# Patient Record
Sex: Male | Born: 1935 | Hispanic: No | Smoking: Former smoker
Health system: Southern US, Community
[De-identification: ages and names within clinical notes are randomized; demographics above are authoritative.]

## PROBLEM LIST (undated history)

## (undated) DIAGNOSIS — I2699 Other pulmonary embolism without acute cor pulmonale: Secondary | ICD-10-CM

## (undated) DIAGNOSIS — R0602 Shortness of breath: Secondary | ICD-10-CM

## (undated) DIAGNOSIS — Z7901 Long term (current) use of anticoagulants: Secondary | ICD-10-CM

## (undated) DIAGNOSIS — I82409 Acute embolism and thrombosis of unspecified deep veins of unspecified lower extremity: Secondary | ICD-10-CM

## (undated) DIAGNOSIS — K2970 Gastritis, unspecified, without bleeding: Secondary | ICD-10-CM

## (undated) HISTORY — DX: Other pulmonary embolism without acute cor pulmonale: I26.99

## (undated) HISTORY — DX: Acute embolism and thrombosis of unspecified deep veins of unspecified lower extremity: I82.409

## (undated) HISTORY — DX: Gastritis, unspecified, without bleeding: K29.70

## (undated) HISTORY — DX: Long term (current) use of anticoagulants: Z79.01

## (undated) HISTORY — DX: Shortness of breath: R06.02

---

## 2010-04-19 ENCOUNTER — Ambulatory Visit (INDEPENDENT_AMBULATORY_CARE_PROVIDER_SITE_OTHER): Payer: Medicare Other

## 2010-04-19 ENCOUNTER — Emergency Department (HOSPITAL_COMMUNITY): Payer: Medicare Other

## 2010-04-19 ENCOUNTER — Inpatient Hospital Stay (HOSPITAL_COMMUNITY)
Admission: EM | Admit: 2010-04-19 | Discharge: 2010-04-24 | DRG: 176 | Disposition: A | Discharge status: Home / Self Care | Payer: Medicare Other | Attending: Cardiovascular Disease | Admitting: Cardiovascular Disease

## 2010-04-19 ENCOUNTER — Inpatient Hospital Stay (INDEPENDENT_AMBULATORY_CARE_PROVIDER_SITE_OTHER)
Admission: RE | Admit: 2010-04-19 | Discharge: 2010-04-19 | Disposition: A | Discharge status: Home / Self Care | Payer: Medicare Other | Source: Ambulatory Visit | Attending: Family Medicine | Admitting: Family Medicine

## 2010-04-19 DIAGNOSIS — I209 Angina pectoris, unspecified: Secondary | ICD-10-CM

## 2010-04-19 DIAGNOSIS — I824Y9 Acute embolism and thrombosis of unspecified deep veins of unspecified proximal lower extremity: Secondary | ICD-10-CM | POA: Diagnosis present

## 2010-04-19 DIAGNOSIS — I10 Essential (primary) hypertension: Secondary | ICD-10-CM | POA: Diagnosis present

## 2010-04-19 DIAGNOSIS — I2699 Other pulmonary embolism without acute cor pulmonale: Principal | ICD-10-CM | POA: Diagnosis present

## 2010-04-19 DIAGNOSIS — K2970 Gastritis, unspecified, without bleeding: Secondary | ICD-10-CM | POA: Diagnosis present

## 2010-04-19 LAB — CBC
HCT: 38.9 % — ABNORMAL LOW (ref 39.0–52.0)
MCH: 29.4 pg (ref 26.0–34.0)
MCHC: 32.9 g/dL (ref 30.0–36.0)
RDW: 13.9 % (ref 11.5–15.5)

## 2010-04-19 LAB — BASIC METABOLIC PANEL
Calcium: 8.7 mg/dL (ref 8.4–10.5)
Creatinine, Ser: 1.24 mg/dL (ref 0.4–1.5)
GFR calc non Af Amer: 57 mL/min — ABNORMAL LOW (ref >60)
Glucose, Bld: 78 mg/dL (ref 70–99)
Sodium: 141 mEq/L (ref 135–145)

## 2010-04-19 LAB — DIFFERENTIAL
Basophils Absolute: 0 10*3/uL (ref 0.0–0.1)
Basophils Relative: 0 % (ref 0–1)
Eosinophils Relative: 3 % (ref 0–5)
Lymphocytes Relative: 23 % (ref 12–46)
Monocytes Absolute: 0.7 10*3/uL (ref 0.1–1.0)
Monocytes Relative: 7 % (ref 3–12)

## 2010-04-19 LAB — CK TOTAL AND CKMB (NOT AT ARMC): Relative Index: 1.6 (ref 0.0–2.5)

## 2010-04-20 ENCOUNTER — Emergency Department (HOSPITAL_COMMUNITY): Payer: Medicare Other

## 2010-04-20 DIAGNOSIS — R0602 Shortness of breath: Secondary | ICD-10-CM

## 2010-04-20 DIAGNOSIS — R079 Chest pain, unspecified: Secondary | ICD-10-CM

## 2010-04-20 DIAGNOSIS — R072 Precordial pain: Secondary | ICD-10-CM

## 2010-04-20 LAB — CK TOTAL AND CKMB (NOT AT ARMC)
CK, MB: 2.1 ng/mL (ref 0.3–4.0)
CK, MB: 2.5 ng/mL (ref 0.3–4.0)
Relative Index: 1.6 (ref 0.0–2.5)
Relative Index: 1.6 (ref 0.0–2.5)
Total CK: 135 U/L (ref 7–232)

## 2010-04-20 LAB — PROTIME-INR: INR: 1.19 (ref 0.00–1.49)

## 2010-04-20 LAB — BRAIN NATRIURETIC PEPTIDE: Pro B Natriuretic peptide (BNP): 59 pg/mL (ref 0.0–100.0)

## 2010-04-20 LAB — TROPONIN I: Troponin I: 0.01 ng/mL (ref 0.00–0.06)

## 2010-04-20 LAB — HEPARIN LEVEL (UNFRACTIONATED): Heparin Unfractionated: 0.79 IU/mL — ABNORMAL HIGH (ref 0.30–0.70)

## 2010-04-20 LAB — TSH: TSH: 0.906 u[IU]/mL (ref 0.350–4.500)

## 2010-04-20 MED ORDER — IOHEXOL 300 MG/ML  SOLN
80.0000 mL | Freq: Once | INTRAMUSCULAR | Status: AC | PRN
  Administered 2010-04-20: 80 mL via INTRAVENOUS

## 2010-04-21 DIAGNOSIS — I2699 Other pulmonary embolism without acute cor pulmonale: Secondary | ICD-10-CM

## 2010-04-21 LAB — PROTIME-INR
INR: 1.11 (ref 0.00–1.49)
Prothrombin Time: 14.5 seconds (ref 11.6–15.2)

## 2010-04-21 LAB — HEPARIN LEVEL (UNFRACTIONATED): Heparin Unfractionated: 0.58 IU/mL (ref 0.30–0.70)

## 2010-04-21 LAB — CBC
MCH: 30 pg (ref 26.0–34.0)
MCHC: 33.5 g/dL (ref 30.0–36.0)
RDW: 14.2 % (ref 11.5–15.5)

## 2010-04-22 LAB — CBC
HCT: 36.4 % — ABNORMAL LOW (ref 39.0–52.0)
MCH: 29.8 pg (ref 26.0–34.0)
MCV: 89.7 fL (ref 78.0–100.0)
RDW: 14.3 % (ref 11.5–15.5)
WBC: 7.9 10*3/uL (ref 4.0–10.5)

## 2010-04-22 LAB — HEPARIN LEVEL (UNFRACTIONATED): Heparin Unfractionated: 0.51 IU/mL (ref 0.30–0.70)

## 2010-04-22 NOTE — Consult Note (Signed)
NAME:  Phillip Sandoval, Phillip Sandoval                ACCOUNT NO.:  [id number]  MEDICAL RECORD NO.:  [id number]           PATIENT TYPE:  I  LOCATION:  3741                         FACILITY:  [facility]  PHYSICIAN:  Noralyn Pick. Eden Emms, MD, FACCDATE OF BIRTH:  08/13/1932  DATE OF CONSULTATION: DATE OF DISCHARGE:                                CONSULTATION   A 77 year old patient we were asked to see in the CDU for shortness of breath, question chest pain.  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  He has had increasing dyspnea for 2 weeks.  He is still quite active and looks quite younger than his stated age.  He goes to [employer] and does martial arts on a daily basis.  He has had progressive dyspnea with some chest tightness when his dyspnea is bad.  He denies cough, fever, or syncope.  He has had no lower extremity edema or history of DVT or PE.  As per the CDU protocol, his point-of-care markers were negative.  His baseline EKG was normal.  He was sent for a stress echo.  I reviewed his images and EKG.  His resting echo images were normal with a good EF. His stress images were also normal, but his maximum heart rate was only 100 beats per minute, and he exercise less than 3 minutes.  Because of a nondiagnostic stress echo, we were asked to see him.  He currently is asymptomatic.  However, he complains of fairly marked change in his breathing pattern and dyspnea over the last 2 weeks.  A 10-point review of systems is negative including no cough, fever, or sputum.  No lower extremity edema.  No previous history of valvular disease or coronary artery disease.  PAST MEDICAL HISTORY:  Remarkable for hypertension, which he is on Lopressor for and gastritis, which he takes Protonix for.  He denies any allergies.  He is divorced.  He has family in the area.  He is from [place] originally.  He does not smoke or drink.  He quit smoking over 10 years ago.  He is  originally from [place].  He is extremely active going to the [employer] and doing martial arts on a regular basis.  FAMILY HISTORY:  Noncontributory.  Mother and father do not have premature coronary disease.  PHYSICAL EXAMINATION:  GENERAL:  Remarkable for comfortable male in no distress, looking younger than stated age. VITAL SIGNS:  Stable.  Blood pressure is 118/64, resting pulse was 76. HEENT:  Unremarkable. NECK:  Carotids normal without bruit.  No lymphadenopathy, thyromegaly, or JVP elevation. LUNGS:  Clear.  Good diaphragmatic motion.  No wheezing. CARDIAC:  Normal S1 and S2.  Normal heart sounds.  PMI normal. ABDOMEN:  Benign.  Bowel sounds positive.  No AAA.  No tenderness.  No bruit.  No hepatosplenomegaly, hepatojugular reflux, or tenderness. EXTREMITIES:  Distal pulses were intact.  No edema. NEURO:  Nonfocal. SKIN:  Warm and dry.  No muscular weakness.  Chest x-ray shows a bit of a tortuous aorta with no active disease. Stress echo reviewed.  Normal resting and stress  images.  No EKG changes, but nondiagnostic due to failure to achieve adequate heart rate.  Point-of-care enzymes are negative.  IMPRESSION: 1. Progressive dyspnea with some pressure during dyspnea.  Clinical     symptoms disproportionate to physical findings.  Check D-dimer and     BNP. Low threshold to get CT to R/O PE as this is most likely diagnosis     Admit to the hospital. Start heparin if d-dimer positive  Further     workup based on the results of his laboratory data. 2. Hypertension.  Continue Lopressor 25 b.i.d. 3. History of gastritis.  Continue Protonix.     Noralyn Pick. Eden Emms, MD, [facility]     PCN/MEDQ  D:  04/20/2010  T:  04/21/2010  Job:  [id number]  Electronically Signed by Charlton Haws MD [employer] on 04/22/2010 03:38:35 PM

## 2010-04-23 LAB — CBC
HCT: 36.8 % — ABNORMAL LOW (ref 39.0–52.0)
Hemoglobin: 12.4 g/dL — ABNORMAL LOW (ref 13.0–17.0)
MCHC: 33.7 g/dL (ref 30.0–36.0)
MCV: 90.6 fL (ref 78.0–100.0)
WBC: 7.3 10*3/uL (ref 4.0–10.5)

## 2010-04-23 LAB — PROTIME-INR
INR: 1.87 — ABNORMAL HIGH (ref 0.00–1.49)
Prothrombin Time: 21.7 s — ABNORMAL HIGH (ref 11.6–15.2)

## 2010-04-24 LAB — CBC
HCT: 36.1 % — ABNORMAL LOW (ref 39.0–52.0)
Hemoglobin: 12 g/dL — ABNORMAL LOW (ref 13.0–17.0)
MCV: 90 fL (ref 78.0–100.0)
WBC: 7.5 10*3/uL (ref 4.0–10.5)

## 2010-04-24 LAB — PROTIME-INR: INR: 2.52 — ABNORMAL HIGH (ref 0.00–1.49)

## 2010-04-26 ENCOUNTER — Ambulatory Visit (INDEPENDENT_AMBULATORY_CARE_PROVIDER_SITE_OTHER): Payer: Medicare Other | Admitting: *Deleted

## 2010-04-26 DIAGNOSIS — I82409 Acute embolism and thrombosis of unspecified deep veins of unspecified lower extremity: Secondary | ICD-10-CM

## 2010-04-26 DIAGNOSIS — I2699 Other pulmonary embolism without acute cor pulmonale: Secondary | ICD-10-CM | POA: Insufficient documentation

## 2010-04-26 DIAGNOSIS — Z7901 Long term (current) use of anticoagulants: Secondary | ICD-10-CM

## 2010-04-26 LAB — POCT INR: INR: 2.2

## 2010-04-28 NOTE — Discharge Summary (Signed)
NAME:  [name], [name]                ACCOUNT NO.:  [id number]  MEDICAL RECORD NO.:  [id number]           PATIENT TYPE:  I  LOCATION:  3741                         FACILITY:  [facility]  PHYSICIAN:  Noralyn Pick. Eden Emms, MD, FACCDATE OF BIRTH:  04/11/1932  DATE OF ADMISSION:  04/19/2010 DATE OF DISCHARGE:  04/24/2010                              DISCHARGE SUMMARY   PROCEDURES: 1. CT angiogram of the chest. 2. Chest x-ray. 3. Stress echocardiogram. 4. Arterial and venous lower extremity ultrasound.  PRIMARY, FINAL, DISCHARGE DIAGNOSES: 1. Bilateral pulmonary emboli, right greater than left. 2. Bilateral deep vein thrombosis in the femoral and popliteal veins     by ultrasound. 3. Dyspnea secondary to pulmonary embolism.  SECONDARY DIAGNOSES: 1. History of gastritis for which he takes Protonix. 2. The patient was listed as having hypertension, however, this is an     incorrect diagnosis and he has never been diagnosed with     hypertension.  TIME AT DISCHARGE:  42 minutes.  HOSPITAL COURSE:  Mr. Phillip Sandoval is a 77 year old male with no previous cardiac issues.  He came to the hospital for dyspnea and his cardiac enzymes were negative for MI.  His BNP and TSH were within normal limits.  Initially, he was scheduled for stress echo.  The target heart rate was not achieved.  The 85% of his predicted maximum was 122.  Since he only achieved a HR of 100 Cardiology was consulted.  After interviewing the patient and reviewing the echo, a CT angiogram of the chest was ordered.  The CT angiogram of the chest showed acute bilateral pulmonary emboli with moderately large clot burden.  The right distal main pulmonary artery and all lobes of the right lung were involved.  On the left, it was mainly in the lower lobe, but also in the upper lobe.  He was started on heparin and Coumadin.  Lower extremity Dopplers were performed, which showed bilateral DVTs in the femoral veins and popliteal veins.  The CT  angiogram of the chest had shown no mediastinal adenopathy and the only structural abnormality was a moderately large hiatal hernia.  His Coumadin and his anticoagulation with Lovenox were managed by pharmacy.  Mr. Pardee dyspnea improved.  Initially, it was thought that he had a history of hypertension and was on metoprolol, but both of these turned out to be incorrect.  He does not carry a diagnosis of hypertension and has never taken metoprolol prior to admission.  On April 24, 2010, Mr. Leatham INR was therapeutic at 2.52.  His vital signs were stable with a blood pressure of 111/77 and O2 saturation of 97% on room air.  His heart rate was in the 60s.  He was evaluated by Dr. Eden Emms and considered stable for discharge, to follow up as an outpatient.  DISCHARGE INSTRUCTIONS:  His activity level is to be increased gradually.  He is not to do any lifting for 3 weeks.  He may shower.  He is not to have any sexual activity for 2 weeks.  He is encouraged to stick to a low-sodium heart-healthy diet.  He is to follow  up with Dr. Eden Emms and our office will call.  He is to follow up with Dr. Ronne Binning as needed.  He is to get a Coumadin. Check on April 26, 2010 (Mr. Koelzer prefers to get the check at Dr. Dimas Millin office).  DISCHARGE MEDICATIONS: 1. Prilosec 20 mg a day. 2. Coumadin 5 mg a day or as directed.     Theodore Demark, PA-C   ______________________________ Noralyn Pick. Eden Emms, MD, [facility]    RB/MEDQ  D:  04/24/2010  T:  04/24/2010  Job:  [id number]  cc:   Dr. Ronne Binning [facility]  Electronically Signed by Theodore Demark PA-C on 04/27/2010 09:48:00 AM Electronically Signed by Charlton Haws MD [facility] on 04/28/2010 11:31:48 AM

## 2010-05-03 ENCOUNTER — Encounter: Payer: Medicare Other | Admitting: *Deleted

## 2010-05-13 ENCOUNTER — Encounter: Payer: Self-pay | Admitting: *Deleted

## 2010-05-13 ENCOUNTER — Encounter: Payer: Self-pay | Admitting: Cardiovascular Disease

## 2010-05-16 ENCOUNTER — Encounter: Payer: Medicare Other | Admitting: Cardiovascular Disease

## 2010-05-31 ENCOUNTER — Encounter: Payer: Medicare Other | Admitting: Cardiovascular Disease

## 2010-06-28 ENCOUNTER — Encounter: Payer: Self-pay | Admitting: Cardiovascular Disease

## 2010-08-03 ENCOUNTER — Encounter: Payer: Self-pay | Admitting: Cardiovascular Disease

## 2010-08-03 ENCOUNTER — Ambulatory Visit (INDEPENDENT_AMBULATORY_CARE_PROVIDER_SITE_OTHER): Payer: Medicare Other | Admitting: Cardiovascular Disease

## 2010-08-03 ENCOUNTER — Ambulatory Visit (INDEPENDENT_AMBULATORY_CARE_PROVIDER_SITE_OTHER): Payer: Medicare Other | Admitting: *Deleted

## 2010-08-03 DIAGNOSIS — I82409 Acute embolism and thrombosis of unspecified deep veins of unspecified lower extremity: Secondary | ICD-10-CM

## 2010-08-03 DIAGNOSIS — Z7901 Long term (current) use of anticoagulants: Secondary | ICD-10-CM

## 2010-08-03 DIAGNOSIS — I2699 Other pulmonary embolism without acute cor pulmonale: Secondary | ICD-10-CM

## 2010-08-03 NOTE — Assessment & Plan Note (Signed)
Dysnpnea improved  CTA in 3 months to assess clots.  Coumadin for a year 4/13

## 2010-08-03 NOTE — Assessment & Plan Note (Signed)
Check INR today.  F/U at least monthly in our clinic

## 2010-08-03 NOTE — Progress Notes (Signed)
Phillip Sandoval is a 77 year old male with no previous cardiac issues. He came to the hospital for dyspnea and his cardiac  enzymes were negative for MI. His BNP and TSH were within normal limits. Initially, he was scheduled for stress echo. The target heart  rate was not achieved. The 85% of his predicted maximum was 122. Since he only achieved a HR of 100 We were  consulted.  After interviewing the patient and reviewing the echo, a CT angiogram of the chest was ordered.  The CT angiogram of the chest showed acute bilateral pulmonary emboli with moderately large clot burden. The right distal main pulmonary  artery and all lobes of the right lung were involved. On the left, it was mainly in the lower lobe, but also in the upper lobe. He was  started on heparin and Coumadin.  Has been on blood thinners since D/C 4/15.    Has not had INR checked sine middle of April !!!  Discussed the importance of this with him Will reestablish with clinic today.  ROS: Denies fever, malais, weight loss, blurry vision, decreased visual acuity, cough, sputum, SOB, hemoptysis, pleuritic pain, palpitaitons, heartburn, abdominal pain, melena, lower extremity edema, claudication, or rash.  All other systems reviewed and negative  General: Affect appropriate Healthy:  appears stated age HEENT: normal Neck supple with no adenopathy JVP normal no bruits no thyromegaly Lungs clear with no wheezing and good diaphragmatic motion Heart:  S1/S2 no murmur,rub, gallop or click PMI normal Abdomen: benighn, BS positve, no tenderness, no AAA no bruit.  No HSM or HJR Distal pulses intact with no bruits No edema Neuro non-focal Skin warm and dry No muscular weakness   Current Outpatient Prescriptions  Medication Sig Dispense Refill  . Multiple Vitamin (MULTIVITAMIN) capsule Take 1 capsule by mouth daily.        Marland Kitchen warfarin (COUMADIN) 5 MG tablet As directed        Allergies  Review of patient's allergies indicates not on  file.  Electrocardiogram:  Assessment and Plan

## 2010-08-03 NOTE — Patient Instructions (Addendum)
Your physician recommends that you schedule a follow-up appointment in: 3 MONTHS  NEEDS COUMADIN CLINIC APPT TODAY INR CHECK NOT BEEN CHECKED SINCE April 2012  Your physician recommends that you continue on your current medications as directed. Please refer to the Current Medication list given to you today.  Non-Cardiac CT Angiography (CTA), is a special type of CT scan that uses a computer to produce multi-dimensional views of major blood vessels throughout the body. In CT angiography, a contrast material is injected through an IV to help visualize the blood vessels IN 3 MONTHS SEE DR Eden Emms SAME DAY  Hx PE Your physician has requested that you have a Lower extremity venous duplex. This test is an ultrasound of the veins in the legs or arms. It looks at venous blood flow that carries blood from the heart to the legs or arms. Allow one hour for a Lower Venous exam. There are no restrictions or special instructions.3 MONTHS SEE DR Eden Emms SAME DAY  Dx hx DVT

## 2010-08-03 NOTE — Assessment & Plan Note (Signed)
F/U duplex in 3 months to see if clots recanulizing and establish risk of post phlebitic syndrome in future

## 2010-08-05 ENCOUNTER — Other Ambulatory Visit: Payer: Self-pay | Admitting: *Deleted

## 2010-08-05 DIAGNOSIS — I82409 Acute embolism and thrombosis of unspecified deep veins of unspecified lower extremity: Secondary | ICD-10-CM

## 2010-08-05 DIAGNOSIS — O2230 Deep phlebothrombosis in pregnancy, unspecified trimester: Secondary | ICD-10-CM

## 2010-08-31 ENCOUNTER — Ambulatory Visit (INDEPENDENT_AMBULATORY_CARE_PROVIDER_SITE_OTHER): Payer: Medicare Other | Admitting: *Deleted

## 2010-08-31 DIAGNOSIS — I2699 Other pulmonary embolism without acute cor pulmonale: Secondary | ICD-10-CM

## 2010-08-31 DIAGNOSIS — I82409 Acute embolism and thrombosis of unspecified deep veins of unspecified lower extremity: Secondary | ICD-10-CM

## 2010-08-31 DIAGNOSIS — Z7901 Long term (current) use of anticoagulants: Secondary | ICD-10-CM

## 2010-08-31 LAB — POCT INR: INR: 1.7

## 2010-09-14 ENCOUNTER — Encounter: Payer: Medicare Other | Admitting: *Deleted

## 2010-10-13 ENCOUNTER — Encounter: Payer: Self-pay | Admitting: *Deleted

## 2010-11-02 ENCOUNTER — Encounter: Payer: Self-pay | Admitting: *Deleted

## 2010-11-04 ENCOUNTER — Encounter: Payer: Medicare Other | Admitting: *Deleted

## 2010-11-04 ENCOUNTER — Ambulatory Visit: Payer: Medicare Other | Admitting: Cardiovascular Disease

## 2010-11-04 ENCOUNTER — Encounter: Payer: Medicare Other | Admitting: Cardiology

## 2010-11-07 ENCOUNTER — Other Ambulatory Visit (INDEPENDENT_AMBULATORY_CARE_PROVIDER_SITE_OTHER): Payer: Medicare Other | Admitting: *Deleted

## 2010-11-07 ENCOUNTER — Other Ambulatory Visit: Payer: Self-pay | Admitting: *Deleted

## 2010-11-07 DIAGNOSIS — Z01810 Encounter for preprocedural cardiovascular examination: Secondary | ICD-10-CM

## 2010-11-07 DIAGNOSIS — I2699 Other pulmonary embolism without acute cor pulmonale: Secondary | ICD-10-CM

## 2010-11-07 LAB — BASIC METABOLIC PANEL
Calcium: 8.7 mg/dL (ref 8.4–10.5)
GFR: 53.59 mL/min — ABNORMAL LOW (ref >60.00)
Potassium: 4.3 mEq/L (ref 3.5–5.1)
Sodium: 142 mEq/L (ref 135–145)

## 2010-11-08 ENCOUNTER — Ambulatory Visit (INDEPENDENT_AMBULATORY_CARE_PROVIDER_SITE_OTHER)
Admission: RE | Admit: 2010-11-08 | Discharge: 2010-11-08 | Disposition: A | Discharge status: Home / Self Care | Payer: Medicare Other | Source: Ambulatory Visit | Attending: Cardiovascular Disease | Admitting: Cardiovascular Disease

## 2010-11-08 DIAGNOSIS — I2699 Other pulmonary embolism without acute cor pulmonale: Secondary | ICD-10-CM

## 2010-11-08 MED ORDER — IOHEXOL 300 MG/ML  SOLN
80.0000 mL | Freq: Once | INTRAMUSCULAR | Status: AC | PRN
  Administered 2010-11-08: 80 mL via INTRAVENOUS

## 2010-11-14 ENCOUNTER — Encounter: Payer: Self-pay | Admitting: *Deleted

## 2010-11-28 ENCOUNTER — Encounter: Payer: Self-pay | Admitting: Cardiovascular Disease

## 2011-01-12 ENCOUNTER — Encounter: Payer: Self-pay | Admitting: Cardiovascular Disease

## 2011-01-12 ENCOUNTER — Ambulatory Visit (INDEPENDENT_AMBULATORY_CARE_PROVIDER_SITE_OTHER): Payer: Medicare Other | Admitting: Cardiovascular Disease

## 2011-01-12 ENCOUNTER — Ambulatory Visit (INDEPENDENT_AMBULATORY_CARE_PROVIDER_SITE_OTHER): Payer: Medicare Other | Admitting: *Deleted

## 2011-01-12 DIAGNOSIS — Z7901 Long term (current) use of anticoagulants: Secondary | ICD-10-CM

## 2011-01-12 DIAGNOSIS — I82409 Acute embolism and thrombosis of unspecified deep veins of unspecified lower extremity: Secondary | ICD-10-CM

## 2011-01-12 DIAGNOSIS — I2699 Other pulmonary embolism without acute cor pulmonale: Secondary | ICD-10-CM

## 2011-01-12 LAB — POCT INR: INR: 2

## 2011-01-12 NOTE — Patient Instructions (Signed)
Your physician wants you to follow-up in:  6 MONTHS WITH DR NISHAN  You will receive a reminder letter in the mail two months in advance. If you don't receive a letter, please call our office to schedule the follow-up appointment. Your physician recommends that you continue on your current medications as directed. Please refer to the Current Medication list given to you today. 

## 2011-01-12 NOTE — Progress Notes (Signed)
Phillip Sandoval is a 78 year old male with no previous cardiac issues. He came to the hospital for dyspnea and his cardiac  enzymes were negative for MI. His BNP and TSH were within normal limits. Initially, he was scheduled for stress echo. The target heart  rate was not achieved. The 85% of his predicted maximum was 122. Since he only achieved a HR of 100 We were consulted.  After interviewing the patient and reviewing the echo, a CT angiogram of the chest was ordered.  The CT angiogram of the chest showed acute bilateral pulmonary emboli with moderately large clot burden. The right distal main pulmonary  artery and all lobes of the right lung were involved. On the left, it was mainly in the lower lobe, but also in the upper lobe. He was  started on heparin and Coumadin.  Last INR in chart is August ?  He travels to [place] a lot and has little insight into the importance of checking his INR but insists he is taking his  coumadin   ROS: Denies fever, malais, weight loss, blurry vision, decreased visual acuity, cough, sputum, SOB, hemoptysis, pleuritic pain, palpitaitons, heartburn, abdominal pain, melena, lower extremity edema, claudication, or rash.  All other systems reviewed and negative  General: Affect appropriate Healthy:  appears stated age HEENT: normal Neck supple with no adenopathy JVP normal no bruits no thyromegaly Lungs clear with no wheezing and good diaphragmatic motion Heart:  S1/S2 no murmur,rub, gallop or click PMI normal Abdomen: benighn, BS positve, no tenderness, no AAA no bruit.  No HSM or HJR Distal pulses intact with no bruits No edema Neuro non-focal Skin warm and dry No muscular weakness   Current Outpatient Prescriptions  Medication Sig Dispense Refill  . Multiple Vitamin (MULTIVITAMIN) capsule Take 1 capsule by mouth daily.        Marland Kitchen warfarin (COUMADIN) 5 MG tablet As directed        Allergies  Review of patient's allergies indicates not on  file.  Electrocardiogram:  Assessment and Plan

## 2011-01-12 NOTE — Assessment & Plan Note (Signed)
No dyspnea.  Continue coumadin.   Resolution of PE;s by F/U CT 10/12

## 2011-01-12 NOTE — Assessment & Plan Note (Signed)
Check INR today.  He has been low most times we are able to check him  Not sure it is wise to stop 4/13 which will be a year from time of PE

## 2011-02-09 ENCOUNTER — Encounter: Payer: Medicare Other | Admitting: *Deleted

## 2011-03-31 ENCOUNTER — Ambulatory Visit (INDEPENDENT_AMBULATORY_CARE_PROVIDER_SITE_OTHER): Payer: Medicare Other | Admitting: *Deleted

## 2011-03-31 DIAGNOSIS — Z7901 Long term (current) use of anticoagulants: Secondary | ICD-10-CM

## 2011-03-31 DIAGNOSIS — I82409 Acute embolism and thrombosis of unspecified deep veins of unspecified lower extremity: Secondary | ICD-10-CM

## 2011-03-31 DIAGNOSIS — I2699 Other pulmonary embolism without acute cor pulmonale: Secondary | ICD-10-CM

## 2011-03-31 LAB — POCT INR: INR: 3

## 2011-04-04 ENCOUNTER — Other Ambulatory Visit: Payer: Self-pay | Admitting: *Deleted

## 2011-04-04 MED ORDER — WARFARIN SODIUM 5 MG PO TABS: ORAL_TABLET | ORAL | Status: DC

## 2011-04-05 ENCOUNTER — Other Ambulatory Visit: Payer: Self-pay | Admitting: Cardiovascular Disease

## 2011-04-05 ENCOUNTER — Other Ambulatory Visit: Payer: Self-pay | Admitting: Cardiology

## 2011-04-05 MED ORDER — WARFARIN SODIUM 5 MG PO TABS: ORAL_TABLET | ORAL | Status: DC

## 2011-06-06 ENCOUNTER — Other Ambulatory Visit: Payer: Self-pay | Admitting: Cardiovascular Disease

## 2011-06-09 ENCOUNTER — Ambulatory Visit (INDEPENDENT_AMBULATORY_CARE_PROVIDER_SITE_OTHER): Payer: Medicare Other

## 2011-06-09 DIAGNOSIS — I2699 Other pulmonary embolism without acute cor pulmonale: Secondary | ICD-10-CM

## 2011-06-09 DIAGNOSIS — I82409 Acute embolism and thrombosis of unspecified deep veins of unspecified lower extremity: Secondary | ICD-10-CM

## 2011-06-09 DIAGNOSIS — Z7901 Long term (current) use of anticoagulants: Secondary | ICD-10-CM

## 2011-06-09 LAB — POCT INR: INR: 1.2

## 2011-06-09 MED ORDER — WARFARIN SODIUM 5 MG PO TABS: ORAL_TABLET | ORAL | Status: DC

## 2011-08-16 ENCOUNTER — Ambulatory Visit (INDEPENDENT_AMBULATORY_CARE_PROVIDER_SITE_OTHER): Payer: Medicare Other | Admitting: *Deleted

## 2011-08-16 ENCOUNTER — Telehealth: Payer: Self-pay | Admitting: Cardiovascular Disease

## 2011-08-16 DIAGNOSIS — I82409 Acute embolism and thrombosis of unspecified deep veins of unspecified lower extremity: Secondary | ICD-10-CM

## 2011-08-16 DIAGNOSIS — Z7901 Long term (current) use of anticoagulants: Secondary | ICD-10-CM

## 2011-08-16 DIAGNOSIS — I2699 Other pulmonary embolism without acute cor pulmonale: Secondary | ICD-10-CM

## 2011-08-16 MED ORDER — WARFARIN SODIUM 5 MG PO TABS: ORAL_TABLET | ORAL | Status: AC

## 2011-08-30 ENCOUNTER — Ambulatory Visit (INDEPENDENT_AMBULATORY_CARE_PROVIDER_SITE_OTHER): Payer: Medicare Other | Admitting: Pharmacist

## 2011-08-30 DIAGNOSIS — Z7901 Long term (current) use of anticoagulants: Secondary | ICD-10-CM

## 2011-08-30 DIAGNOSIS — I2699 Other pulmonary embolism without acute cor pulmonale: Secondary | ICD-10-CM

## 2011-08-30 DIAGNOSIS — I82409 Acute embolism and thrombosis of unspecified deep veins of unspecified lower extremity: Secondary | ICD-10-CM

## 2011-08-30 LAB — POCT INR: INR: 3.5

## 2011-09-13 ENCOUNTER — Ambulatory Visit (INDEPENDENT_AMBULATORY_CARE_PROVIDER_SITE_OTHER): Payer: Medicare Other | Admitting: Pharmacist

## 2011-09-13 DIAGNOSIS — I82409 Acute embolism and thrombosis of unspecified deep veins of unspecified lower extremity: Secondary | ICD-10-CM

## 2011-09-13 DIAGNOSIS — I2699 Other pulmonary embolism without acute cor pulmonale: Secondary | ICD-10-CM

## 2011-09-13 DIAGNOSIS — Z7901 Long term (current) use of anticoagulants: Secondary | ICD-10-CM

## 2011-11-22 ENCOUNTER — Emergency Department (HOSPITAL_COMMUNITY)
Admission: EM | Admit: 2011-11-22 | Discharge: 2011-12-10 | Disposition: E | Payer: Medicare Other | Attending: Emergency Medicine | Admitting: Emergency Medicine

## 2011-11-22 ENCOUNTER — Encounter (HOSPITAL_COMMUNITY): Payer: Self-pay

## 2011-11-22 DIAGNOSIS — Z87891 Personal history of nicotine dependence: Secondary | ICD-10-CM | POA: Insufficient documentation

## 2011-11-22 DIAGNOSIS — Z86718 Personal history of other venous thrombosis and embolism: Secondary | ICD-10-CM | POA: Insufficient documentation

## 2011-11-22 DIAGNOSIS — Z7901 Long term (current) use of anticoagulants: Secondary | ICD-10-CM | POA: Insufficient documentation

## 2011-11-22 DIAGNOSIS — Z86711 Personal history of pulmonary embolism: Secondary | ICD-10-CM | POA: Insufficient documentation

## 2011-11-22 DIAGNOSIS — Z8719 Personal history of other diseases of the digestive system: Secondary | ICD-10-CM | POA: Insufficient documentation

## 2011-11-22 DIAGNOSIS — I469 Cardiac arrest, cause unspecified: Secondary | ICD-10-CM | POA: Insufficient documentation

## 2011-11-22 MED ORDER — DEXTROSE 50 % IV SOLN
INTRAVENOUS | Status: AC | PRN
  Administered 2011-11-22: 50 mL via INTRAVENOUS

## 2011-11-22 MED ORDER — CALCIUM CHLORIDE 10 % IV SOLN
INTRAVENOUS | Status: AC | PRN
  Administered 2011-11-22: 1 g via INTRAVENOUS

## 2011-11-22 MED ORDER — SODIUM BICARBONATE 8.4 % IV SOLN
INTRAVENOUS | Status: AC | PRN
  Administered 2011-11-22: 50 meq via INTRAVENOUS

## 2011-11-22 MED ORDER — EPINEPHRINE HCL 0.1 MG/ML IJ SOLN
INTRAMUSCULAR | Status: AC | PRN
  Administered 2011-11-22 (×3): 1 mg via INTRAVENOUS

## 2011-11-22 MED FILL — Medication: Qty: 1 | Status: AC

## 2011-11-28 ENCOUNTER — Telehealth: Payer: Self-pay | Admitting: Cardiovascular Disease

## 2011-11-28 NOTE — Telephone Encounter (Signed)
Death Certificate presented for completion. Given to [facility] for signature.rmf

## 2011-11-28 NOTE — Telephone Encounter (Signed)
Certificate signed-called [facility] for pick up.Copy made-rmf

## 2011-12-10 NOTE — Code Documentation (Signed)
78 yo male with syncopal episode, became unresponsive in ambulance, CPR was started when reached ambulance bay, King airway placed, atropine given enroute, IO in right shin

## 2011-12-10 NOTE — ED Provider Notes (Signed)
History     CSN: [id number]  Arrival date & time 12/09/2011  4098   First MD Initiated Contact with Patient Dec 09, 2011 1004      Chief Complaint  Patient presents with  . Loss of Consciousness    Patient is a 78 y.o. male presenting with syncope. The history is provided by the EMS personnel. The history is limited by the condition of the patient.  Loss of Consciousness This is a new problem. The current episode started less than 1 hour ago. The problem occurs constantly. The problem has been rapidly worsening. Nothing aggravates the symptoms. Nothing relieves the symptoms.  Pt presents via EMS for unresponsiveness EMS reports patient was lifting weights at [facility], and he had syncopal event He regained consciousness, but was confused/altered EMS reports on their assessment he became apneic but did not lose pulses They attempted intubation without success and then placed [facility] Airway He was given atropine for bradycardia by EMS On arrival to the ED, he lost pulses while being taken out of EMS truck No other details are known  Past Medical History  Diagnosis Date  . Gastritis   . SOB (shortness of breath)   . PE (pulmonary embolism)   . DVT (deep venous thrombosis)   . Long term (current) use of anticoagulants     History reviewed. No pertinent past surgical history.  Family History  Problem Relation Age of Onset  . Coronary artery disease      History  Substance Use Topics  . Smoking status: Former Games developer  . Smokeless tobacco: Not on file  . Alcohol Use: No      Review of Systems  Unable to perform ROS: Mental status change  Cardiovascular: Positive for syncope.    Allergies  Review of patient's allergies indicates no known allergies.  Home Medications   Current Outpatient Rx  Name  Route  Sig  Dispense  Refill  . MULTIVITAMINS PO CAPS   Oral   Take 1 capsule by mouth daily.           . WARFARIN SODIUM 5 MG PO TABS      Take as directed by Coumadin  Clinic   50 tablet   1     Physical Exam CONSTITUTIONAL:unresponsive HEAD AND FACE: Normocephalic/atraumatic EYES: pupils fixed/dilated ENMT: Mucous membranes dry, King airway in place NECK: supple no meningeal signs SPINE:no signs of trauma CV: no spontaneous cardiac activity LUNGS: equal BS with bagging ABDOMEN: soft GU normal appearance, chaperone persent NEURO: GCS 3, pt is unresponsive EXTREMITIES: no deformities SKIN: cold to touch   ED Course  Procedures   Cardiopulmonary Resuscitation (CPR) Procedure Note Directed/Performed by: Joya Gaskins I personally directed ancillary staff and/or performed CPR in an effort to regain return of spontaneous circulation and to maintain cardiac, neuro and systemic perfusion.    1. Cardiac arrest     Pt seen on arrival, on arrival to room he was without pulses CPR started by staff.  He was given epinephrine up to 3 times, he was also given calcium/dextrose/bicarb CPR continued for approximately 15 minutes without ROSC His rhythm was PEA which degenerated into asystole No defibrillation utilized Time of death was approximately 10:02am D/w family - son in law, other family will be coming to hospital Chaplain present for conversation I have called into his PCP for signing of death certificate   MDM  Nursing notes including past medical history and social history reviewed and considered in documentation  Joya Gaskins, MD 20-Dec-2011 1041

## 2011-12-10 NOTE — Progress Notes (Signed)
Pt. came into Ed as CPR and passed shortly after arriving.  Pt. Son-in-law came and spoke with attending doctor.  Son-in-law indicate that the pt.'s daughter was taken a state board examine and would come to hospital after testing.  Daughter is not aware that father has passed.  I spoke with [facility] to arrange visit to morgue when daughter arrives. Provided presence, guidiance  and emotional support.    2011/12/11 1000  Clinical Encounter Type  Visited With Family;Health care provider  Visit Type Death;ED  Referral From Nurse  Spiritual Encounters  Spiritual Needs Emotional;Grief support

## 2011-12-10 NOTE — ED Notes (Addendum)
Pt clothing, watch, cell phone, ring, bracelet and necklace, weight belt and glasses given to son in law [place]

## 2011-12-10 NOTE — ED Notes (Signed)
78 yo male that was lifting weights, had syncopal episode, became unresponsive in the ambulance, and pulseless at the ambulance bay doors, King airway, IO in right shin, atropine given

## 2011-12-10 DEATH — deceased

## 2012-01-04 ENCOUNTER — Ambulatory Visit: Payer: Self-pay | Admitting: Cardiovascular Disease

## 2012-08-28 IMAGING — CT CT ANGIO CHEST
2 of 6 series · 19 of 36 positions shown · IV contrast (Omnipaque 300)
Comparison: CT angio chest of 04/20/2010

CLINICAL DATA: History of pulmonary emboli in [place], on blood
thinners, follow-up

CT ANGIOGRAPHY CHEST WITH CONTRAST
TECHNIQUE: Multidetector CT imaging of the chest was performed
using the standard protocol during bolus administration of
intravenous contrast.  Multiplanar CT image reconstructions
including MIPs were obtained to evaluate the vascular anatomy.
Contrast: 80mL OMNIPAQUE IOHEXOL 300 MG/ML IV SOLN

[Series 6: thins (id) / (id) · axial · 0.63mm/px · z∈[-168,+40]mm · 18 of 232 slices shown]
[im 12/232  lung]
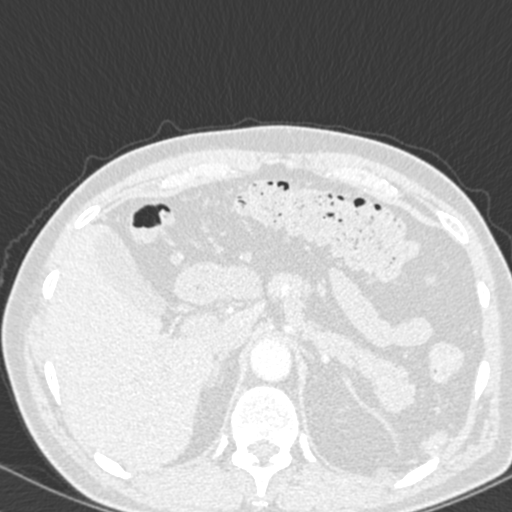
[im 24/232  mediastinal]
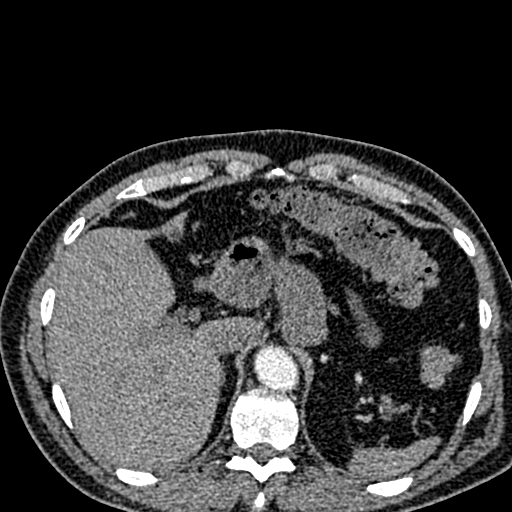
[im 35/232  lung]
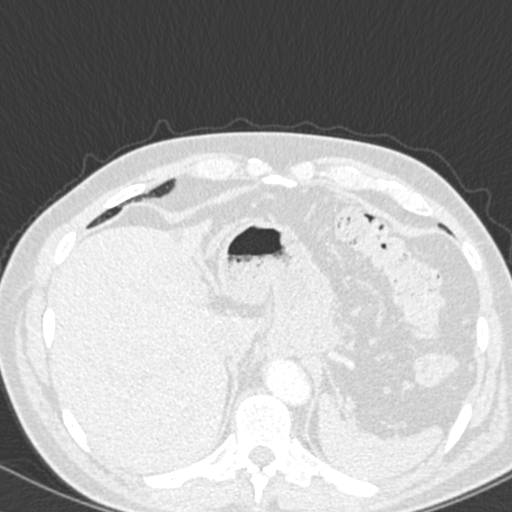
[im 47/232  mediastinal]
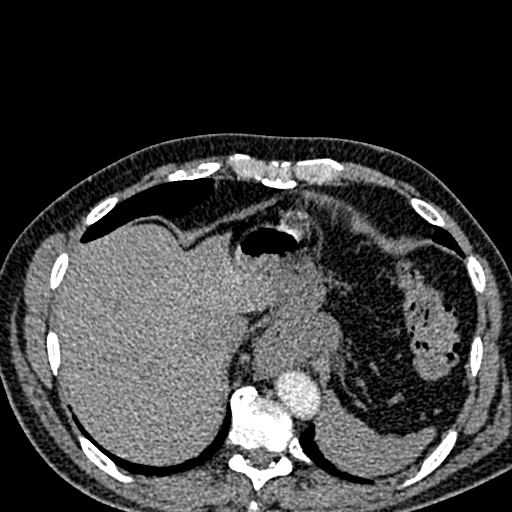
[im 58/232  lung]
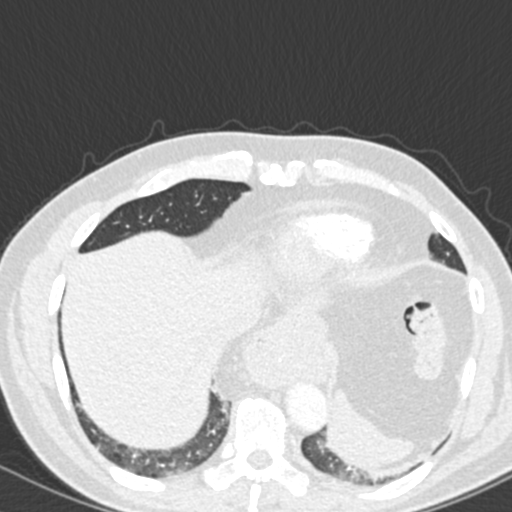
[im 70/232  mediastinal]
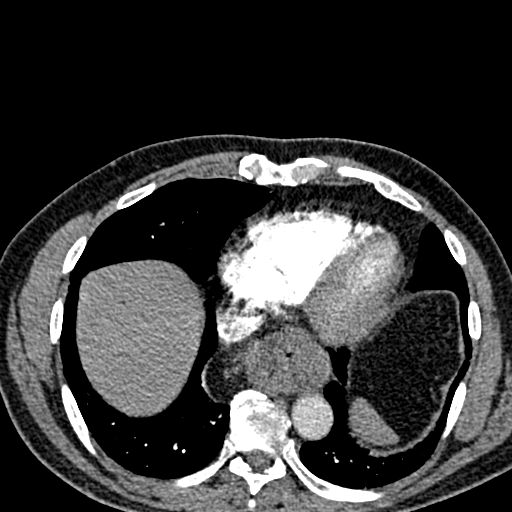
[im 81/232  lung]
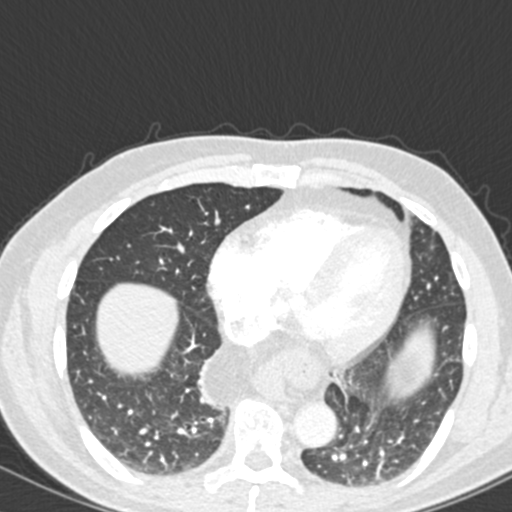
[im 93/232  mediastinal]
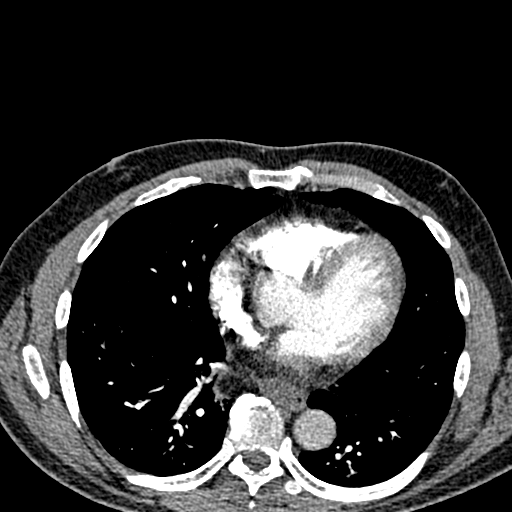
[im 104/232  lung]
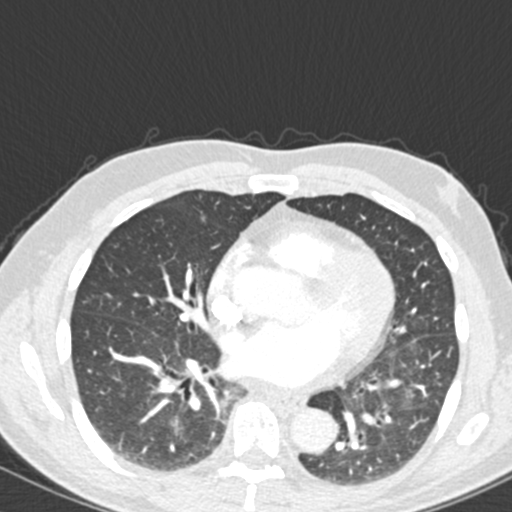
[im 128/232  mediastinal]
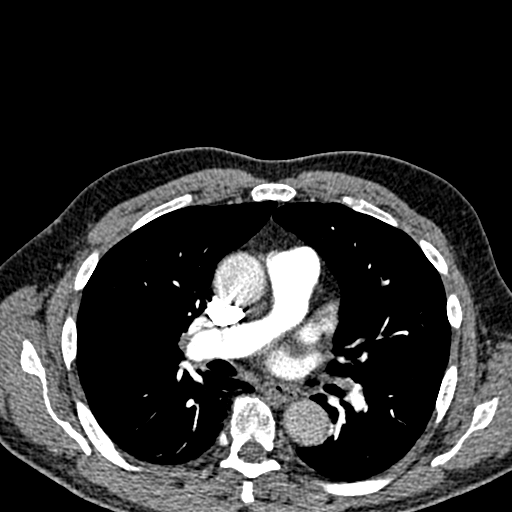
[im 139/232  lung]
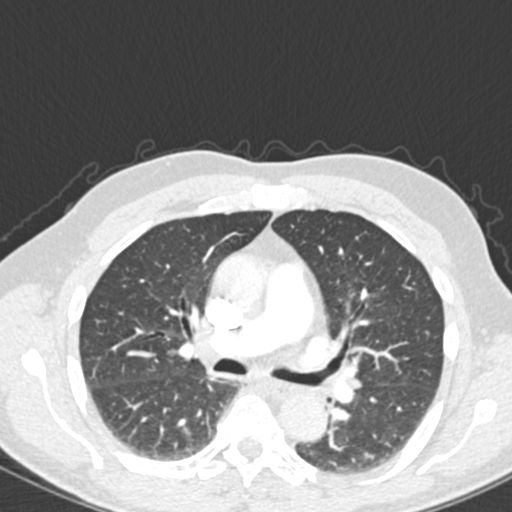
[im 151/232  mediastinal]
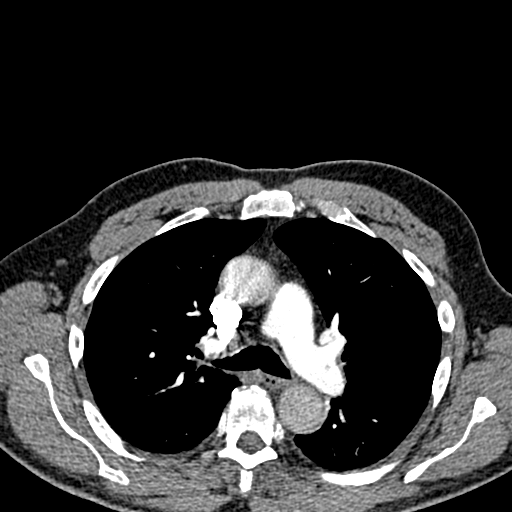
[im 162/232  lung]
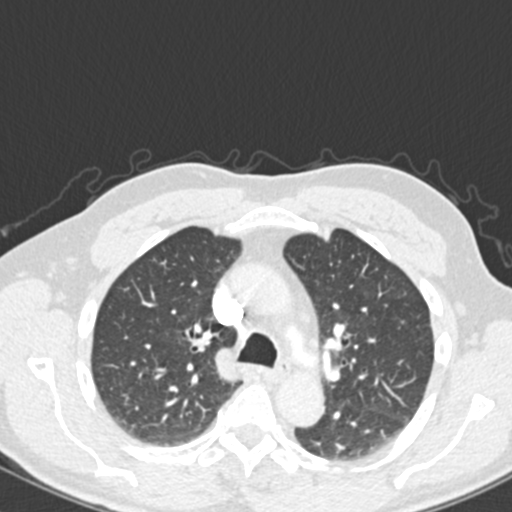
[im 174/232  mediastinal]
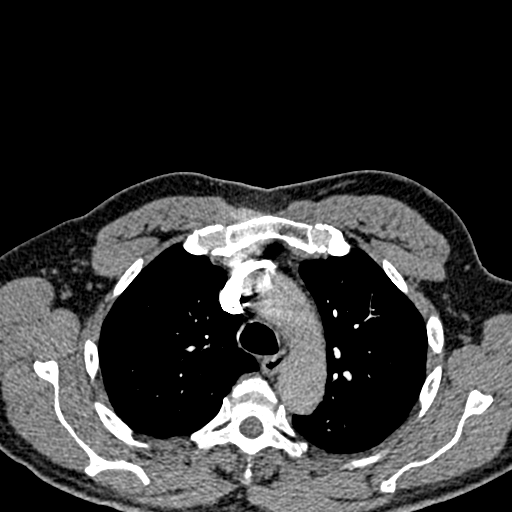
[im 185/232  lung]
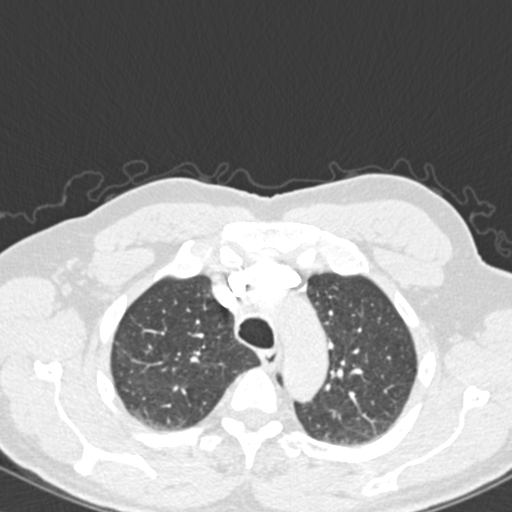
[im 197/232  mediastinal]
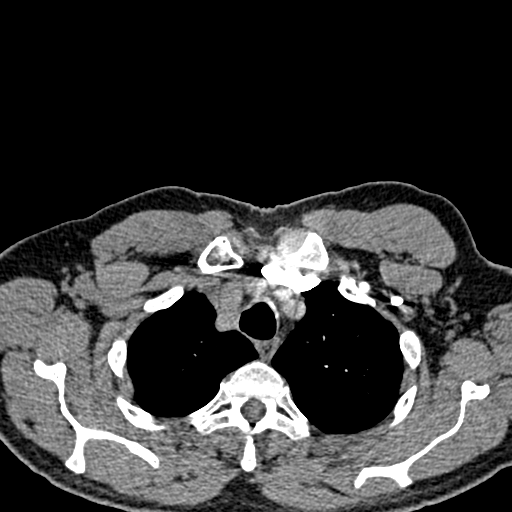
[im 208/232  lung]
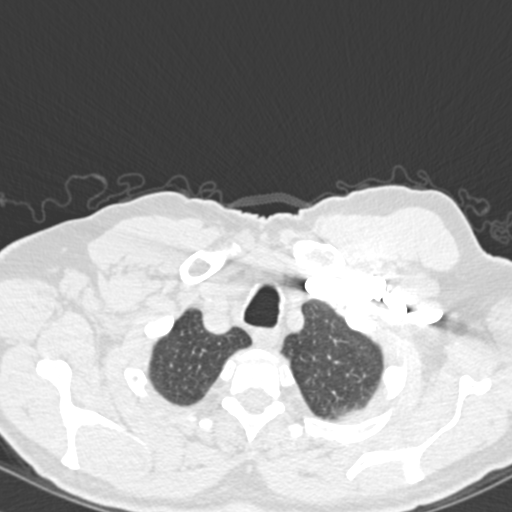
[im 220/232  mediastinal]
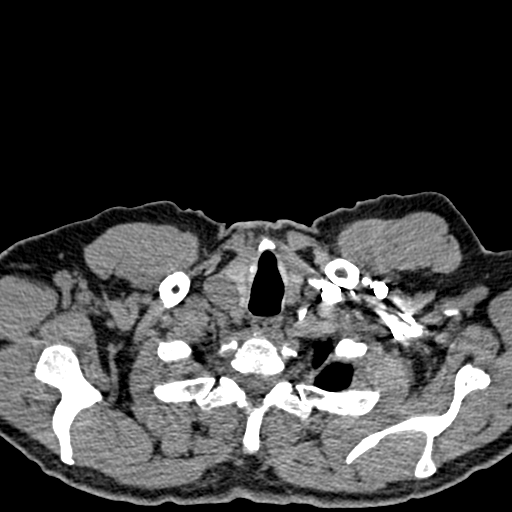

[Series 602: cor mpr · coronal · 0.63mm/px · 1 of 99 slices shown]
[im 50/99  mediastinal]
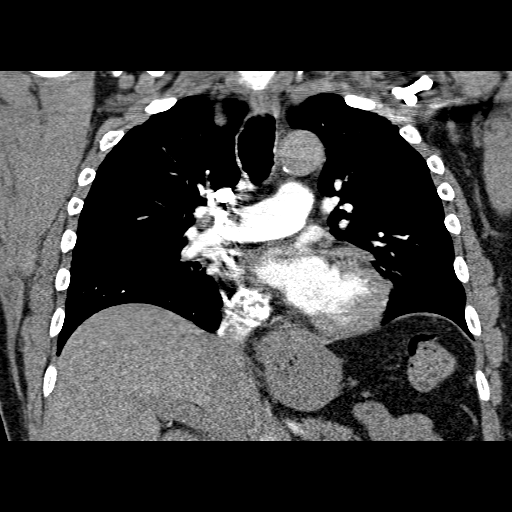

[19 of 36 positions shown; findings below may reference images not displayed]

FINDINGS: The previously noted pulmonary emboli have resolved in
the interval.  No residual pulmonary emboli or new pulmonary emboli
are evident on today's study.  There is moderate opacification of
the pulmonary arteries.  The thoracic aorta is not as well
opacified but no acute abnormality is seen.  No mediastinal or
hilar adenopathy is noted.  Again a moderate sized hiatal hernia is
present.  Cardiomegaly is stable.

On the lung window images, no lung infiltrate is seen.  No
suspicious lung nodule is noted.  No pleural effusion is seen.
There are degenerative changes in the thoracic spine.

Review of the MIP images confirms the above findings.
IMPRESSION: 1.  Resolution of previously noted pulmonary emboli.  No residual
or new pulmonary emboli are noted.
2.  Moderate sized hiatal hernia.
3.  Cardiomegaly.

## 2013-10-24 NOTE — Telephone Encounter (Signed)
Nothing was typed/tmj
